# Patient Record
Sex: Female | Born: 1955 | Race: White | Hispanic: No | Marital: Married
Health system: Southern US, Community
[De-identification: ages and names within clinical notes are randomized; demographics above are authoritative.]

## PROBLEM LIST (undated history)

## (undated) HISTORY — PX: CHOLECYSTECTOMY: SHX55

## (undated) HISTORY — PX: KNEE SURGERY: SHX244

---

## 1998-09-23 ENCOUNTER — Other Ambulatory Visit: Admission: RE | Admit: 1998-09-23 | Discharge: 1998-09-23 | Payer: Self-pay | Admitting: Obstetrics and Gynecology

## 1999-03-23 ENCOUNTER — Encounter: Payer: Self-pay | Admitting: Obstetrics and Gynecology

## 1999-03-23 ENCOUNTER — Ambulatory Visit (HOSPITAL_COMMUNITY): Admission: RE | Admit: 1999-03-23 | Discharge: 1999-03-23 | Payer: Self-pay | Admitting: Obstetrics and Gynecology

## 1999-12-13 ENCOUNTER — Other Ambulatory Visit: Admission: RE | Admit: 1999-12-13 | Discharge: 1999-12-13 | Payer: Self-pay | Admitting: Obstetrics and Gynecology

## 2000-05-18 ENCOUNTER — Ambulatory Visit (HOSPITAL_COMMUNITY): Admission: RE | Admit: 2000-05-18 | Discharge: 2000-05-18 | Payer: Self-pay | Admitting: Obstetrics and Gynecology

## 2000-05-18 ENCOUNTER — Encounter: Payer: Self-pay | Admitting: Obstetrics and Gynecology

## 2001-02-12 ENCOUNTER — Other Ambulatory Visit: Admission: RE | Admit: 2001-02-12 | Discharge: 2001-02-12 | Payer: Self-pay | Admitting: Obstetrics and Gynecology

## 2003-05-28 ENCOUNTER — Ambulatory Visit (HOSPITAL_COMMUNITY): Admission: RE | Admit: 2003-05-28 | Discharge: 2003-05-28 | Payer: Self-pay | Admitting: Obstetrics and Gynecology

## 2005-01-27 ENCOUNTER — Ambulatory Visit (HOSPITAL_COMMUNITY): Admission: RE | Admit: 2005-01-27 | Discharge: 2005-01-27 | Payer: Self-pay | Admitting: Obstetrics and Gynecology

## 2005-11-13 ENCOUNTER — Other Ambulatory Visit: Admission: RE | Admit: 2005-11-13 | Discharge: 2005-11-13 | Payer: Self-pay | Admitting: Obstetrics and Gynecology

## 2006-02-13 ENCOUNTER — Ambulatory Visit (HOSPITAL_COMMUNITY): Admission: RE | Admit: 2006-02-13 | Discharge: 2006-02-13 | Payer: Self-pay | Admitting: Obstetrics and Gynecology

## 2007-02-11 ENCOUNTER — Other Ambulatory Visit: Admission: RE | Admit: 2007-02-11 | Discharge: 2007-02-11 | Payer: Self-pay | Admitting: Obstetrics and Gynecology

## 2007-02-28 ENCOUNTER — Ambulatory Visit (HOSPITAL_COMMUNITY): Admission: RE | Admit: 2007-02-28 | Discharge: 2007-02-28 | Payer: Self-pay | Admitting: Obstetrics and Gynecology

## 2007-03-07 ENCOUNTER — Encounter: Admission: RE | Admit: 2007-03-07 | Discharge: 2007-03-07 | Payer: Self-pay | Admitting: Obstetrics and Gynecology

## 2007-10-25 ENCOUNTER — Encounter: Admission: RE | Admit: 2007-10-25 | Discharge: 2007-10-25 | Payer: Self-pay | Admitting: Obstetrics and Gynecology

## 2008-03-05 ENCOUNTER — Encounter: Admission: RE | Admit: 2008-03-05 | Discharge: 2008-03-05 | Payer: Self-pay | Admitting: Obstetrics and Gynecology

## 2008-05-07 ENCOUNTER — Other Ambulatory Visit: Admission: RE | Admit: 2008-05-07 | Discharge: 2008-05-07 | Payer: Self-pay | Admitting: Obstetrics and Gynecology

## 2009-03-29 ENCOUNTER — Encounter: Admission: RE | Admit: 2009-03-29 | Discharge: 2009-03-29 | Payer: Self-pay | Admitting: Obstetrics and Gynecology

## 2009-06-28 ENCOUNTER — Other Ambulatory Visit: Admission: RE | Admit: 2009-06-28 | Discharge: 2009-06-28 | Payer: Self-pay | Admitting: Obstetrics and Gynecology

## 2010-04-11 ENCOUNTER — Encounter: Payer: Self-pay | Admitting: Obstetrics and Gynecology

## 2010-05-06 ENCOUNTER — Other Ambulatory Visit: Payer: Self-pay | Admitting: Obstetrics and Gynecology

## 2010-05-06 DIAGNOSIS — Z1231 Encounter for screening mammogram for malignant neoplasm of breast: Secondary | ICD-10-CM

## 2010-05-17 ENCOUNTER — Ambulatory Visit
Admission: RE | Admit: 2010-05-17 | Discharge: 2010-05-17 | Disposition: A | Discharge status: Home / Self Care | Payer: BC Managed Care – PPO | Source: Ambulatory Visit | Attending: Obstetrics and Gynecology | Admitting: Obstetrics and Gynecology

## 2010-05-17 DIAGNOSIS — Z1231 Encounter for screening mammogram for malignant neoplasm of breast: Secondary | ICD-10-CM

## 2010-07-18 ENCOUNTER — Other Ambulatory Visit: Payer: Self-pay | Admitting: Obstetrics and Gynecology

## 2010-07-18 ENCOUNTER — Other Ambulatory Visit (HOSPITAL_COMMUNITY)
Admission: RE | Admit: 2010-07-18 | Discharge: 2010-07-18 | Disposition: A | Discharge status: Home / Self Care | Payer: BC Managed Care – PPO | Source: Ambulatory Visit | Attending: Obstetrics and Gynecology | Admitting: Obstetrics and Gynecology

## 2010-07-18 DIAGNOSIS — Z01419 Encounter for gynecological examination (general) (routine) without abnormal findings: Secondary | ICD-10-CM | POA: Insufficient documentation

## 2011-06-12 ENCOUNTER — Other Ambulatory Visit: Payer: Self-pay | Admitting: Obstetrics and Gynecology

## 2011-06-12 DIAGNOSIS — Z1231 Encounter for screening mammogram for malignant neoplasm of breast: Secondary | ICD-10-CM

## 2011-06-27 ENCOUNTER — Ambulatory Visit
Admission: RE | Admit: 2011-06-27 | Discharge: 2011-06-27 | Disposition: A | Discharge status: Home / Self Care | Payer: BC Managed Care – PPO | Source: Ambulatory Visit | Attending: Obstetrics and Gynecology | Admitting: Obstetrics and Gynecology

## 2011-06-27 DIAGNOSIS — Z1231 Encounter for screening mammogram for malignant neoplasm of breast: Secondary | ICD-10-CM

## 2011-07-19 ENCOUNTER — Other Ambulatory Visit (HOSPITAL_COMMUNITY)
Admission: RE | Admit: 2011-07-19 | Discharge: 2011-07-19 | Disposition: A | Discharge status: Home / Self Care | Payer: BC Managed Care – PPO | Source: Ambulatory Visit | Attending: Obstetrics and Gynecology | Admitting: Obstetrics and Gynecology

## 2011-07-19 ENCOUNTER — Other Ambulatory Visit: Payer: Self-pay | Admitting: Obstetrics and Gynecology

## 2011-07-19 DIAGNOSIS — Z01419 Encounter for gynecological examination (general) (routine) without abnormal findings: Secondary | ICD-10-CM | POA: Insufficient documentation

## 2012-07-23 ENCOUNTER — Other Ambulatory Visit: Payer: Self-pay

## 2012-07-23 DIAGNOSIS — Z1231 Encounter for screening mammogram for malignant neoplasm of breast: Secondary | ICD-10-CM

## 2012-08-29 ENCOUNTER — Ambulatory Visit
Admission: RE | Admit: 2012-08-29 | Discharge: 2012-08-29 | Disposition: A | Discharge status: Home / Self Care | Payer: BC Managed Care – PPO | Source: Ambulatory Visit

## 2012-08-29 DIAGNOSIS — Z1231 Encounter for screening mammogram for malignant neoplasm of breast: Secondary | ICD-10-CM

## 2012-12-18 ENCOUNTER — Other Ambulatory Visit (HOSPITAL_COMMUNITY)
Admission: RE | Admit: 2012-12-18 | Discharge: 2012-12-18 | Disposition: A | Discharge status: Home / Self Care | Payer: BC Managed Care – PPO | Source: Ambulatory Visit | Attending: Obstetrics and Gynecology | Admitting: Obstetrics and Gynecology

## 2012-12-18 ENCOUNTER — Other Ambulatory Visit: Payer: Self-pay | Admitting: Obstetrics and Gynecology

## 2012-12-18 DIAGNOSIS — Z1151 Encounter for screening for human papillomavirus (HPV): Secondary | ICD-10-CM | POA: Insufficient documentation

## 2012-12-18 DIAGNOSIS — Z01419 Encounter for gynecological examination (general) (routine) without abnormal findings: Secondary | ICD-10-CM | POA: Insufficient documentation

## 2013-09-29 ENCOUNTER — Other Ambulatory Visit: Payer: Self-pay | Admitting: Obstetrics and Gynecology

## 2013-09-29 DIAGNOSIS — N6452 Nipple discharge: Secondary | ICD-10-CM

## 2013-10-03 ENCOUNTER — Other Ambulatory Visit: Payer: Self-pay | Admitting: Obstetrics and Gynecology

## 2013-10-03 ENCOUNTER — Ambulatory Visit
Admission: RE | Admit: 2013-10-03 | Discharge: 2013-10-03 | Disposition: A | Discharge status: Home / Self Care | Payer: BC Managed Care – PPO | Source: Ambulatory Visit | Attending: Obstetrics and Gynecology | Admitting: Obstetrics and Gynecology

## 2013-10-03 DIAGNOSIS — N6452 Nipple discharge: Secondary | ICD-10-CM

## 2013-11-26 ENCOUNTER — Other Ambulatory Visit: Payer: Self-pay | Admitting: Obstetrics and Gynecology

## 2013-11-26 ENCOUNTER — Other Ambulatory Visit (HOSPITAL_COMMUNITY)
Admission: RE | Admit: 2013-11-26 | Discharge: 2013-11-26 | Disposition: A | Discharge status: Home / Self Care | Payer: BC Managed Care – PPO | Source: Ambulatory Visit | Attending: Obstetrics and Gynecology | Admitting: Obstetrics and Gynecology

## 2013-11-26 DIAGNOSIS — Z01419 Encounter for gynecological examination (general) (routine) without abnormal findings: Secondary | ICD-10-CM | POA: Diagnosis not present

## 2013-11-27 LAB — CYTOLOGY - PAP

## 2014-10-30 ENCOUNTER — Other Ambulatory Visit: Payer: Self-pay

## 2014-10-30 DIAGNOSIS — Z1231 Encounter for screening mammogram for malignant neoplasm of breast: Secondary | ICD-10-CM

## 2014-11-05 ENCOUNTER — Ambulatory Visit
Admission: RE | Admit: 2014-11-05 | Discharge: 2014-11-05 | Disposition: A | Discharge status: Home / Self Care | Payer: BLUE CROSS/BLUE SHIELD | Source: Ambulatory Visit

## 2014-11-05 DIAGNOSIS — Z1231 Encounter for screening mammogram for malignant neoplasm of breast: Secondary | ICD-10-CM

## 2014-11-06 ENCOUNTER — Other Ambulatory Visit: Payer: Self-pay | Admitting: Orthopedic Surgery

## 2014-11-06 DIAGNOSIS — M25562 Pain in left knee: Secondary | ICD-10-CM

## 2014-11-11 ENCOUNTER — Ambulatory Visit
Admission: RE | Admit: 2014-11-11 | Discharge: 2014-11-11 | Disposition: A | Discharge status: Home / Self Care | Payer: BLUE CROSS/BLUE SHIELD | Source: Ambulatory Visit | Attending: Orthopedic Surgery | Admitting: Orthopedic Surgery

## 2014-11-11 DIAGNOSIS — S83282A Other tear of lateral meniscus, current injury, left knee, initial encounter: Secondary | ICD-10-CM | POA: Insufficient documentation

## 2014-11-11 DIAGNOSIS — M25562 Pain in left knee: Secondary | ICD-10-CM | POA: Diagnosis present

## 2014-11-11 DIAGNOSIS — M25462 Effusion, left knee: Secondary | ICD-10-CM | POA: Insufficient documentation

## 2014-11-11 DIAGNOSIS — M7122 Synovial cyst of popliteal space [Baker], left knee: Secondary | ICD-10-CM | POA: Diagnosis not present

## 2014-11-11 DIAGNOSIS — X58XXXA Exposure to other specified factors, initial encounter: Secondary | ICD-10-CM | POA: Insufficient documentation

## 2014-11-11 DIAGNOSIS — S83242A Other tear of medial meniscus, current injury, left knee, initial encounter: Secondary | ICD-10-CM | POA: Diagnosis not present

## 2014-11-11 DIAGNOSIS — M7050 Other bursitis of knee, unspecified knee: Secondary | ICD-10-CM | POA: Insufficient documentation

## 2014-11-17 ENCOUNTER — Other Ambulatory Visit (HOSPITAL_COMMUNITY)
Admission: RE | Admit: 2014-11-17 | Discharge: 2014-11-17 | Disposition: A | Discharge status: Home / Self Care | Payer: BLUE CROSS/BLUE SHIELD | Source: Ambulatory Visit | Attending: Obstetrics and Gynecology | Admitting: Obstetrics and Gynecology

## 2014-11-17 ENCOUNTER — Other Ambulatory Visit: Payer: Self-pay | Admitting: Obstetrics and Gynecology

## 2014-11-17 DIAGNOSIS — Z01419 Encounter for gynecological examination (general) (routine) without abnormal findings: Secondary | ICD-10-CM | POA: Insufficient documentation

## 2014-11-19 LAB — CYTOLOGY - PAP

## 2014-12-09 ENCOUNTER — Emergency Department (HOSPITAL_COMMUNITY)
Admission: EM | Admit: 2014-12-09 | Discharge: 2014-12-09 | Disposition: A | Discharge status: Home / Self Care | Payer: BLUE CROSS/BLUE SHIELD | Attending: Emergency Medicine | Admitting: Emergency Medicine

## 2014-12-09 ENCOUNTER — Emergency Department (HOSPITAL_COMMUNITY): Payer: BLUE CROSS/BLUE SHIELD

## 2014-12-09 ENCOUNTER — Encounter (HOSPITAL_COMMUNITY): Payer: Self-pay | Admitting: Emergency Medicine

## 2014-12-09 DIAGNOSIS — Z79899 Other long term (current) drug therapy: Secondary | ICD-10-CM | POA: Diagnosis not present

## 2014-12-09 DIAGNOSIS — M25462 Effusion, left knee: Secondary | ICD-10-CM

## 2014-12-09 DIAGNOSIS — M25562 Pain in left knee: Secondary | ICD-10-CM | POA: Diagnosis present

## 2014-12-09 DIAGNOSIS — Z9889 Other specified postprocedural states: Secondary | ICD-10-CM | POA: Diagnosis not present

## 2014-12-09 NOTE — ED Notes (Signed)
Pt states she had surgery on her torn meniscus on Sept. 2nd this year. States this morning she woke up with pain to the left knee (medial/posterior), redness and mild swelling to left lower leg. States she's had it elevated to help relieve some of the swelling for most of the day.

## 2014-12-09 NOTE — Discharge Instructions (Signed)
Rest your leg 2-3 days.  Take your aleve as needed.  Follow up with your md next week

## 2014-12-11 NOTE — ED Provider Notes (Addendum)
CSN: [id number]     Arrival date & time 12/09/14  1952 History   First MD Initiated Contact with Patient 12/09/14 2006     Chief Complaint  Patient presents with  . Knee Pain  . Leg Swelling     (Consider location/radiation/quality/duration/timing/severity/associated sxs/prior Treatment) Patient is a 59 y.o. female presenting with knee pain. The history is provided by the patient (the pt complains of left knee pain and swelling.  she recently had surgery to her left knee and wallke a lot recently).  Knee Pain Lower extremity pain location: left knee. Pain details:    Quality:  Aching   Radiates to:  Does not radiate   Severity:  Mild   Onset quality:  Sudden   Timing:  Constant Associated symptoms: no back pain and no fatigue     History reviewed. No pertinent past medical history. Past Surgical History  Procedure Laterality Date  . Cesarean section    . Cholecystectomy    . Knee surgery Left     Meniscus   History reviewed. No pertinent family history. Social History  Substance Use Topics  . Smoking status: None  . Smokeless tobacco: None  . Alcohol Use: None   OB History    No data available     Review of Systems  Constitutional: Negative for appetite change and fatigue.  HENT: Negative for congestion, ear discharge and sinus pressure.   Eyes: Negative for discharge.  Respiratory: Negative for cough.   Cardiovascular: Negative for chest pain.  Gastrointestinal: Negative for abdominal pain and diarrhea.  Genitourinary: Negative for frequency and hematuria.  Musculoskeletal: Negative for back pain.       Left knee pain  Skin: Negative for rash.  Neurological: Negative for seizures and headaches.  Psychiatric/Behavioral: Negative for hallucinations.      Allergies  Review of patient's allergies indicates no known allergies.  Home Medications   Prior to Admission medications   Medication Sig Start Date End Date Taking? Authorizing Provider  naproxen  sodium (ANAPROX) 220 MG tablet Take 220-440 mg by mouth every 6 (six) hours as needed (pain).   Yes Historical Provider, MD  predniSONE (STERAPRED UNI-PAK 21 TAB) 10 MG (21) TBPK tablet Take 10 mg by mouth daily. 6 day tapper dose pack.   Yes Historical Provider, MD  venlafaxine XR (EFFEXOR-XR) 150 MG 24 hr capsule Take 150 mg by mouth daily. 10/15/14  Yes Historical Provider, MD   BP 132/85 mmHg  Pulse 90  Temp(Src) 98.2 F (36.8 C) (Oral)  Resp 18  SpO2 98% Physical Exam  Constitutional: She is oriented to person, place, and time. She appears well-developed.  HENT:  Head: Normocephalic.  Eyes: Conjunctivae are normal.  Neck: No tracheal deviation present.  Cardiovascular:  No murmur heard. Musculoskeletal: Normal range of motion.  Mild tender swollen left knee  Neurological: She is oriented to person, place, and time.  Skin: Skin is warm.  Psychiatric: She has a normal mood and affect.    ED Course  Procedures (including critical care time) Labs Review Labs Reviewed - No data to display  Imaging Review Dg Knee Complete 4 Views Left  12/09/2014   CLINICAL DATA:  LEFT knee pain and swelling upon awakening this morning, post LEFT knee surgery 2.5 weeks ago  EXAM: LEFT KNEE - COMPLETE 4+ VIEW  COMPARISON:  MRI LEFT knee 11/11/2014  FINDINGS: Osseous demineralization.  Joint space narrowing and spur formation at medial compartment.  Minimal patellofemoral degenerative changes.  Small knee joint  effusion.  No acute fracture, dislocation, or bone destruction.  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  No acute osseous findings.   Electronically Signed   By: Ulyses Southward M.D.   On: 12/09/2014 21:40   I have personally reviewed and evaluated these images and lab results as part of my medical decision-making.   EKG Interpretation None      MDM   Final diagnoses:  Knee effusion, left    X-ray unremarkable.  Pt will rest  knee and follow up with her ortho md    Bethann Berkshire, MD 12/11/14 2841  Bethann Berkshire, MD 12/23/14 [phone]

## 2015-12-24 ENCOUNTER — Other Ambulatory Visit: Payer: Self-pay | Admitting: Obstetrics and Gynecology

## 2015-12-24 DIAGNOSIS — Z1231 Encounter for screening mammogram for malignant neoplasm of breast: Secondary | ICD-10-CM

## 2016-01-03 ENCOUNTER — Other Ambulatory Visit: Payer: Self-pay | Admitting: Obstetrics and Gynecology

## 2016-01-03 ENCOUNTER — Other Ambulatory Visit (HOSPITAL_COMMUNITY)
Admission: RE | Admit: 2016-01-03 | Discharge: 2016-01-03 | Disposition: A | Discharge status: Home / Self Care | Payer: BLUE CROSS/BLUE SHIELD | Source: Ambulatory Visit | Attending: Obstetrics and Gynecology | Admitting: Obstetrics and Gynecology

## 2016-01-03 DIAGNOSIS — Z1151 Encounter for screening for human papillomavirus (HPV): Secondary | ICD-10-CM | POA: Insufficient documentation

## 2016-01-03 DIAGNOSIS — Z01419 Encounter for gynecological examination (general) (routine) without abnormal findings: Secondary | ICD-10-CM | POA: Insufficient documentation

## 2016-01-07 LAB — CYTOLOGY - PAP
DIAGNOSIS: NEGATIVE
HPV: NOT DETECTED

## 2016-01-13 ENCOUNTER — Ambulatory Visit: Payer: BLUE CROSS/BLUE SHIELD

## 2016-02-15 ENCOUNTER — Ambulatory Visit: Payer: BLUE CROSS/BLUE SHIELD

## 2016-05-08 ENCOUNTER — Ambulatory Visit
Admission: RE | Admit: 2016-05-08 | Discharge: 2016-05-08 | Disposition: A | Discharge status: Home / Self Care | Payer: Managed Care, Other (non HMO) | Source: Ambulatory Visit | Attending: Obstetrics and Gynecology | Admitting: Obstetrics and Gynecology

## 2016-05-08 DIAGNOSIS — Z1231 Encounter for screening mammogram for malignant neoplasm of breast: Secondary | ICD-10-CM

## 2016-05-09 ENCOUNTER — Ambulatory Visit: Payer: BLUE CROSS/BLUE SHIELD

## 2017-05-11 ENCOUNTER — Other Ambulatory Visit: Payer: Self-pay | Admitting: Obstetrics and Gynecology

## 2017-05-11 DIAGNOSIS — Z1231 Encounter for screening mammogram for malignant neoplasm of breast: Secondary | ICD-10-CM

## 2017-06-13 ENCOUNTER — Ambulatory Visit
Admission: RE | Admit: 2017-06-13 | Discharge: 2017-06-13 | Disposition: A | Discharge status: Home / Self Care | Payer: Managed Care, Other (non HMO) | Source: Ambulatory Visit | Attending: Obstetrics and Gynecology | Admitting: Obstetrics and Gynecology

## 2017-06-13 DIAGNOSIS — Z1231 Encounter for screening mammogram for malignant neoplasm of breast: Secondary | ICD-10-CM

## 2018-04-08 ENCOUNTER — Other Ambulatory Visit (HOSPITAL_COMMUNITY)
Admission: RE | Admit: 2018-04-08 | Discharge: 2018-04-08 | Disposition: A | Discharge status: Home / Self Care | Payer: 59 | Source: Ambulatory Visit | Attending: Obstetrics and Gynecology | Admitting: Obstetrics and Gynecology

## 2018-04-08 ENCOUNTER — Other Ambulatory Visit: Payer: Self-pay | Admitting: Obstetrics and Gynecology

## 2018-04-08 DIAGNOSIS — Z01419 Encounter for gynecological examination (general) (routine) without abnormal findings: Secondary | ICD-10-CM | POA: Insufficient documentation

## 2018-04-11 LAB — CYTOLOGY - PAP
Adequacy: ABSENT
Diagnosis: NEGATIVE
HPV (WINDOPATH): NOT DETECTED

## 2018-08-02 ENCOUNTER — Other Ambulatory Visit: Payer: Self-pay | Admitting: Obstetrics and Gynecology

## 2018-08-02 DIAGNOSIS — Z1231 Encounter for screening mammogram for malignant neoplasm of breast: Secondary | ICD-10-CM

## 2018-09-24 ENCOUNTER — Ambulatory Visit
Admission: RE | Admit: 2018-09-24 | Discharge: 2018-09-24 | Disposition: A | Discharge status: Home / Self Care | Payer: 59 | Source: Ambulatory Visit | Attending: Obstetrics and Gynecology | Admitting: Obstetrics and Gynecology

## 2018-09-24 ENCOUNTER — Other Ambulatory Visit: Payer: Self-pay

## 2018-09-24 DIAGNOSIS — Z1231 Encounter for screening mammogram for malignant neoplasm of breast: Secondary | ICD-10-CM

## 2019-10-31 ENCOUNTER — Other Ambulatory Visit: Payer: Self-pay | Admitting: Physician Assistant

## 2019-10-31 ENCOUNTER — Other Ambulatory Visit: Payer: Self-pay | Admitting: Obstetrics and Gynecology

## 2019-10-31 DIAGNOSIS — Z1231 Encounter for screening mammogram for malignant neoplasm of breast: Secondary | ICD-10-CM

## 2019-11-03 ENCOUNTER — Ambulatory Visit: Payer: 59

## 2019-11-05 ENCOUNTER — Ambulatory Visit: Payer: 59

## 2020-01-23 ENCOUNTER — Ambulatory Visit
Admission: RE | Admit: 2020-01-23 | Discharge: 2020-01-23 | Disposition: A | Discharge status: Home / Self Care | Payer: 59 | Source: Ambulatory Visit | Attending: Obstetrics and Gynecology | Admitting: Obstetrics and Gynecology

## 2020-01-23 ENCOUNTER — Other Ambulatory Visit: Payer: Self-pay

## 2020-01-23 DIAGNOSIS — Z1231 Encounter for screening mammogram for malignant neoplasm of breast: Secondary | ICD-10-CM

## 2020-08-11 ENCOUNTER — Other Ambulatory Visit: Payer: Self-pay | Admitting: Nurse Practitioner

## 2020-08-11 DIAGNOSIS — Z9189 Other specified personal risk factors, not elsewhere classified: Secondary | ICD-10-CM

## 2020-08-11 DIAGNOSIS — E559 Vitamin D deficiency, unspecified: Secondary | ICD-10-CM

## 2020-09-30 ENCOUNTER — Ambulatory Visit
Admission: RE | Admit: 2020-09-30 | Discharge: 2020-09-30 | Disposition: A | Discharge status: Home / Self Care | Payer: 59 | Source: Ambulatory Visit | Attending: Nurse Practitioner | Admitting: Nurse Practitioner

## 2020-09-30 ENCOUNTER — Other Ambulatory Visit: Payer: Self-pay

## 2020-09-30 DIAGNOSIS — Z9189 Other specified personal risk factors, not elsewhere classified: Secondary | ICD-10-CM

## 2020-09-30 DIAGNOSIS — E559 Vitamin D deficiency, unspecified: Secondary | ICD-10-CM

## 2021-03-18 ENCOUNTER — Other Ambulatory Visit: Payer: Self-pay | Admitting: Obstetrics and Gynecology

## 2021-03-18 DIAGNOSIS — Z1231 Encounter for screening mammogram for malignant neoplasm of breast: Secondary | ICD-10-CM

## 2021-04-07 ENCOUNTER — Ambulatory Visit: Payer: 59

## 2021-04-11 ENCOUNTER — Ambulatory Visit
Admission: RE | Admit: 2021-04-11 | Discharge: 2021-04-11 | Disposition: A | Discharge status: Home / Self Care | Payer: 59 | Source: Ambulatory Visit | Attending: Obstetrics and Gynecology | Admitting: Obstetrics and Gynecology

## 2021-04-11 DIAGNOSIS — Z1231 Encounter for screening mammogram for malignant neoplasm of breast: Secondary | ICD-10-CM

## 2022-04-20 ENCOUNTER — Other Ambulatory Visit: Payer: Self-pay | Admitting: Obstetrics and Gynecology

## 2022-04-20 DIAGNOSIS — Z1231 Encounter for screening mammogram for malignant neoplasm of breast: Secondary | ICD-10-CM

## 2022-06-08 ENCOUNTER — Ambulatory Visit
Admission: RE | Admit: 2022-06-08 | Discharge: 2022-06-08 | Disposition: A | Discharge status: Home / Self Care | Payer: Medicare Other | Source: Ambulatory Visit | Attending: Obstetrics and Gynecology | Admitting: Obstetrics and Gynecology

## 2022-06-08 DIAGNOSIS — Z1231 Encounter for screening mammogram for malignant neoplasm of breast: Secondary | ICD-10-CM

## 2022-07-23 IMAGING — MG MM DIGITAL SCREENING BILAT W/ TOMO AND CAD
6 of 10 series · 6 of 30 positions shown · non-contrast
Comparison: Previous exam(s).

CLINICAL DATA: Screening.

EXAM:
DIGITAL SCREENING BILATERAL MAMMOGRAM WITH TOMOSYNTHESIS AND CAD
TECHNIQUE: Bilateral screening digital craniocaudal and mediolateral oblique
mammograms were obtained. Bilateral screening digital breast
tomosynthesis was performed. The images were evaluated with
computer-aided detection.

[L CC synth-2D]
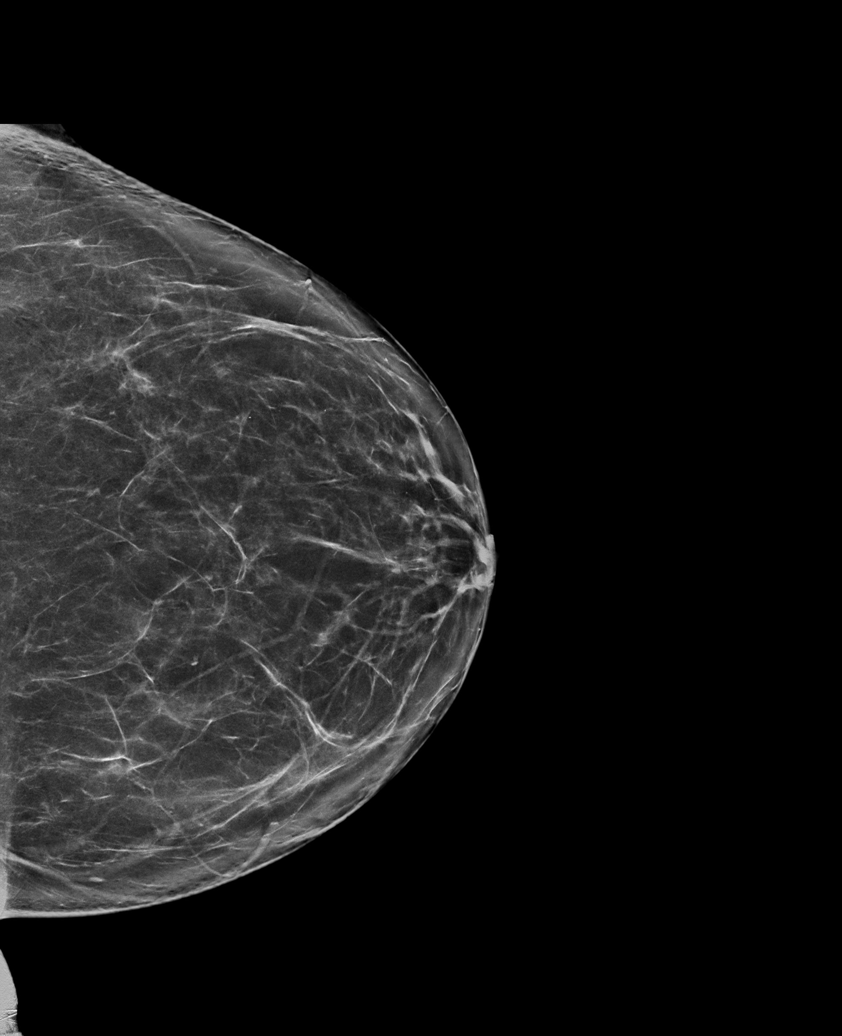

[R CC synth-2D (1 of 2)]
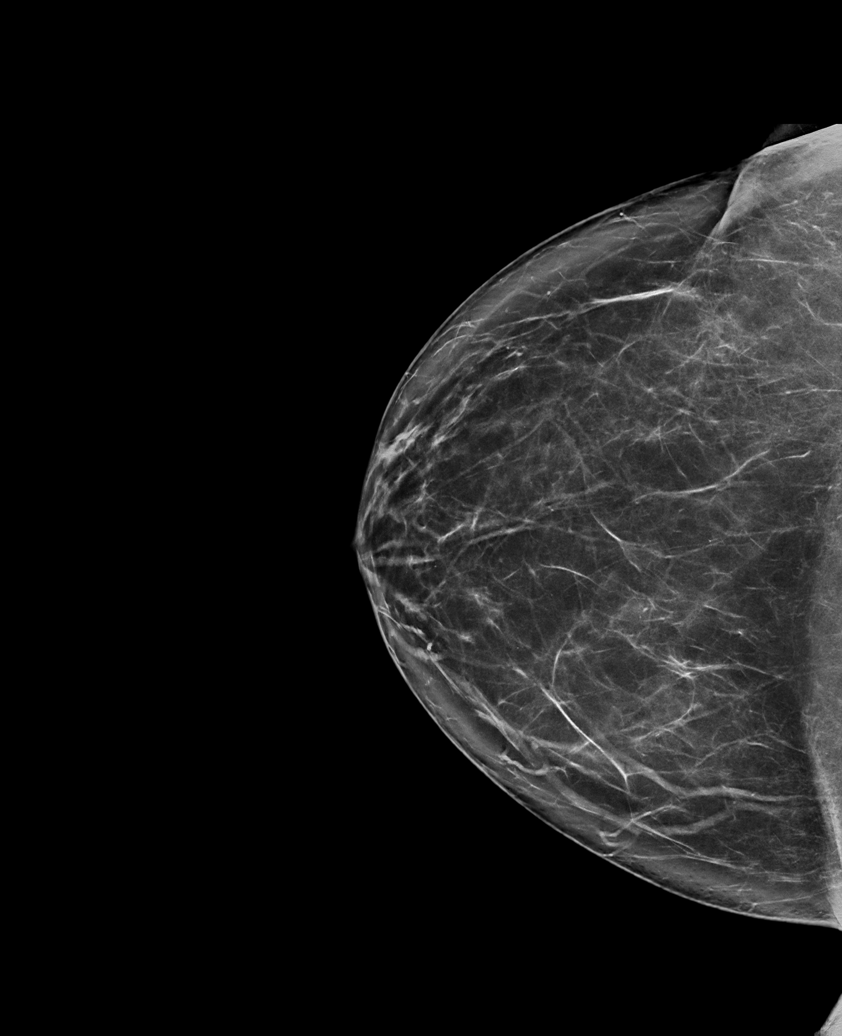

[R CC synth-2D (2 of 2)]
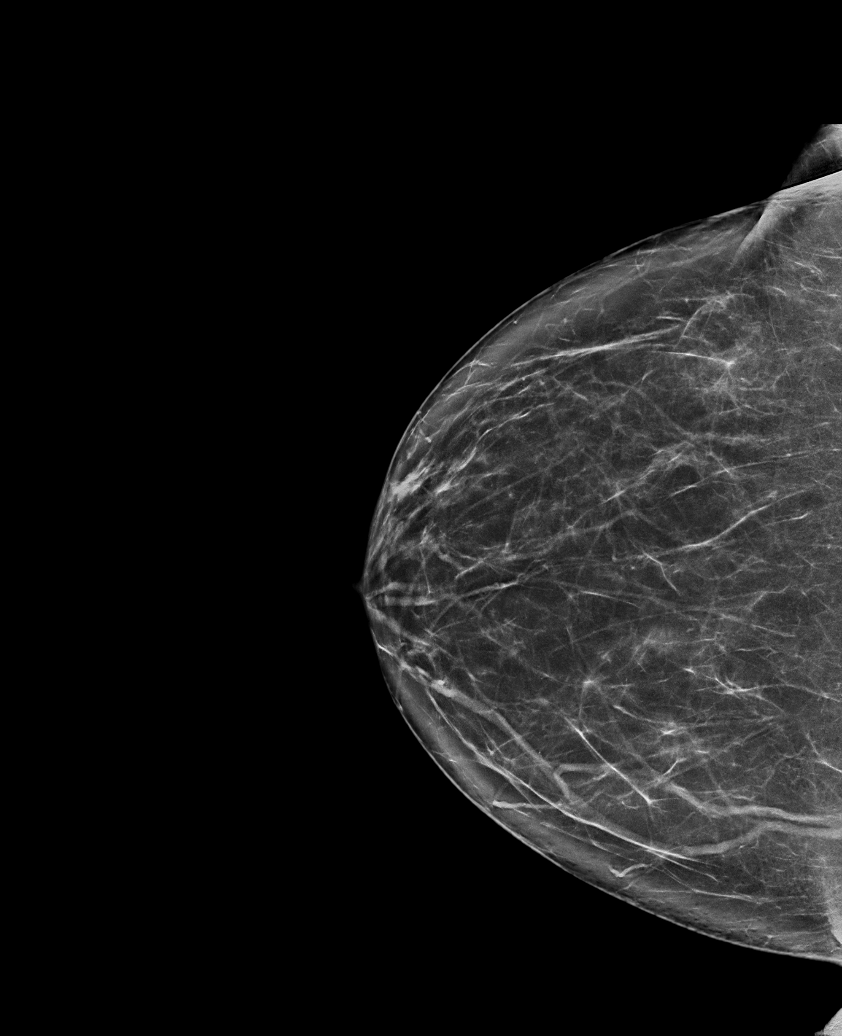

[R MLO synth-2D]
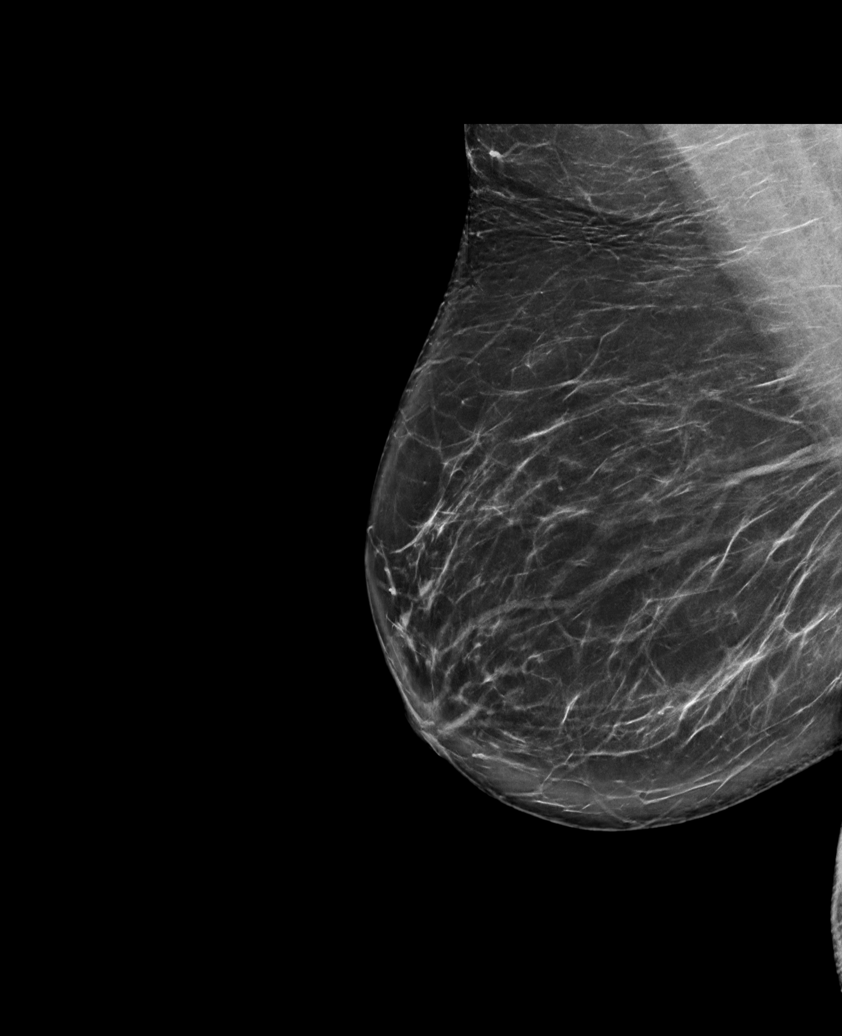

[L MLO synth-2D]
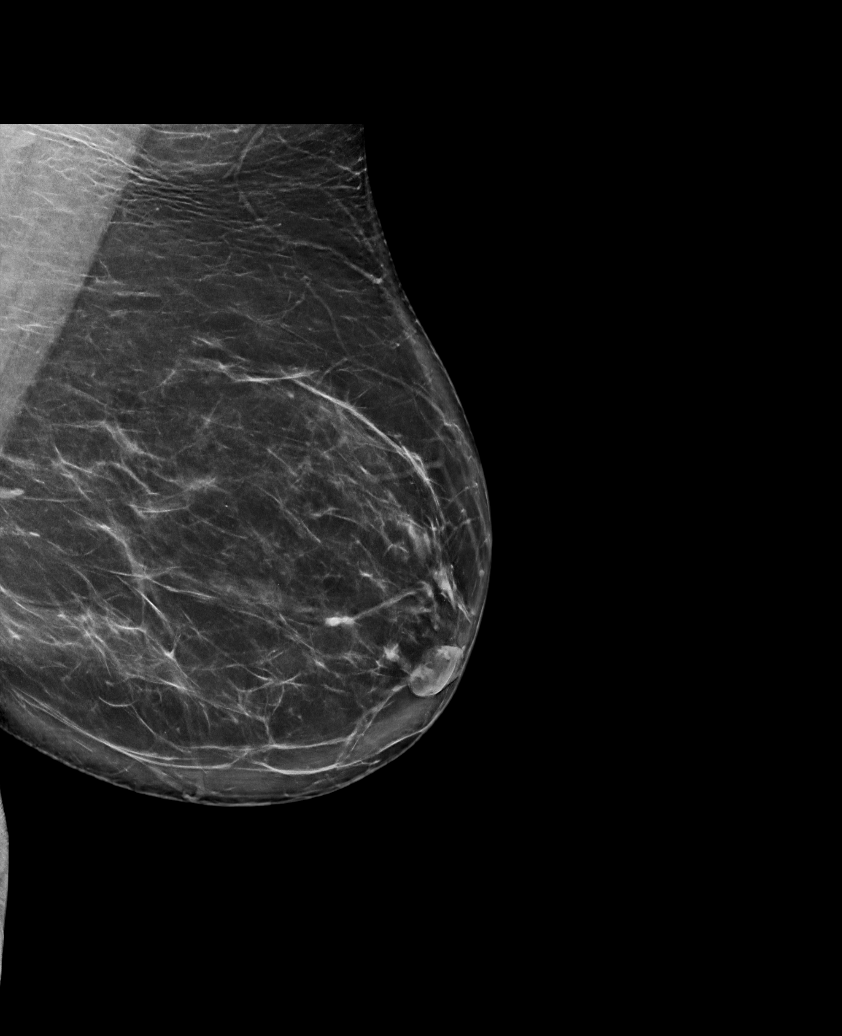

[R MLO tomo · tomo slice 47/93.0]
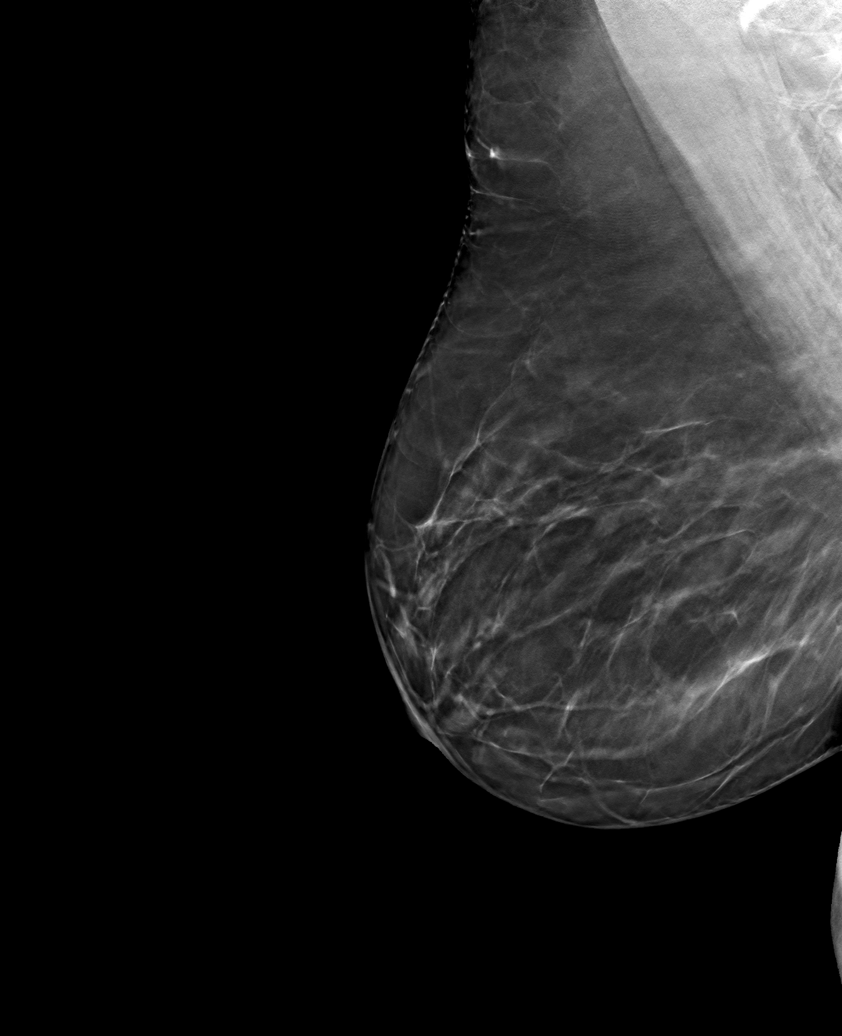

[6 of 30 positions shown; findings below may reference images not displayed]

ACR Breast Density Category b: There are scattered areas of
fibroglandular density.
FINDINGS: There are no findings suspicious for malignancy.
IMPRESSION: No mammographic evidence of malignancy. A result letter of this
screening mammogram will be mailed directly to the patient.

RECOMMENDATION:
Screening mammogram in one year. (Code:51-O-LD2)

BI-RADS CATEGORY  1: Negative.

## 2023-06-05 ENCOUNTER — Other Ambulatory Visit: Payer: Self-pay | Admitting: Endocrinology

## 2023-06-05 DIAGNOSIS — Z1231 Encounter for screening mammogram for malignant neoplasm of breast: Secondary | ICD-10-CM

## 2023-06-19 ENCOUNTER — Ambulatory Visit
Admission: RE | Admit: 2023-06-19 | Discharge: 2023-06-19 | Disposition: A | Discharge status: Home / Self Care | Source: Ambulatory Visit | Attending: Endocrinology | Admitting: Endocrinology

## 2023-06-19 DIAGNOSIS — Z1231 Encounter for screening mammogram for malignant neoplasm of breast: Secondary | ICD-10-CM

## 2023-11-05 ENCOUNTER — Other Ambulatory Visit: Payer: Self-pay | Admitting: Medical Genetics

## 2024-01-17 ENCOUNTER — Other Ambulatory Visit: Payer: Self-pay | Admitting: Medical Genetics

## 2024-01-17 DIAGNOSIS — Z006 Encounter for examination for normal comparison and control in clinical research program: Secondary | ICD-10-CM
